# Patient Record
Sex: Male | Born: 1965
Health system: Southern US, Community
[De-identification: ages and names within clinical notes are randomized; demographics above are authoritative.]

## PROBLEM LIST (undated history)

## (undated) DIAGNOSIS — K219 Gastro-esophageal reflux disease without esophagitis: Secondary | ICD-10-CM

## (undated) HISTORY — PX: KNEE SURGERY: SHX244

---

## 2003-02-22 ENCOUNTER — Ambulatory Visit (HOSPITAL_COMMUNITY): Admission: RE | Admit: 2003-02-22 | Discharge: 2003-02-22 | Payer: Self-pay | Admitting: Orthopedic Surgery

## 2020-01-20 ENCOUNTER — Other Ambulatory Visit: Payer: Self-pay

## 2020-01-20 ENCOUNTER — Encounter: Payer: Self-pay | Admitting: Family Medicine

## 2020-01-20 ENCOUNTER — Ambulatory Visit: Payer: 59 | Admitting: Family Medicine

## 2020-01-20 VITALS — BP 112/80 | HR 81 | Ht 70.0 in | Wt 170.0 lb

## 2020-01-20 DIAGNOSIS — M25511 Pain in right shoulder: Secondary | ICD-10-CM | POA: Diagnosis not present

## 2020-01-20 DIAGNOSIS — M7501 Adhesive capsulitis of right shoulder: Secondary | ICD-10-CM | POA: Diagnosis not present

## 2020-01-20 MED ORDER — METHYLPREDNISOLONE ACETATE 40 MG/ML IJ SUSP
80.0000 mg | Freq: Once | INTRAMUSCULAR | Status: AC
  Administered 2020-01-20: 80 mg via INTRA_ARTICULAR

## 2020-01-20 NOTE — Progress Notes (Signed)
Casey Melnyk T. Willye Javier, MD, CAQ Sports Medicine  Primary Care and Sports Medicine [facility] at [facility] [address] [place], [address]  Phone: [phone]  FAX: [phone]  Casey Wu - 53 y.o. male  MRN [id number]  Date of Birth: 06-Apr-1966  Date: 01/20/2020  PCP: No primary care provider on file.  Referral: No ref. provider found  Chief Complaint  Patient presents with  . Right Shoulder Pain    Started about 3-4 weeks ago. Work makes it worse.     This visit occurred during the SARS-CoV-2 public health emergency.  Safety protocols were in place, including screening questions prior to the visit, additional usage of staff PPE, and extensive cleaning of exam room while observing appropriate contact time as indicated for disinfecting solutions.   Subjective:   Casey Wu is a 54 y.o. very pleasant male patient who presents with the following: shoulder pain  The patient noted above presents with shoulder pain that has been ongoing for 1 month there is no history of trauma or accident. The patient denies neck pain or radicular symptoms. No shoulder blade pain Denies dislocation, subluxation, separation of the shoulder. The patient does complain of pain with flexion, abduction, and terminal motion.  Significant restriction of motion. he describes a deep ache around the shoulder, and sometimes it will wake the patient up at night.  Started bothering him at work, and doing a different job. 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  Started to get some shoulder pain and was uing his left a little bit more.   Having to bracewith his right.   No improvement with the weekend and resting.  Was not killing him. 14 days with no improvement. Poppingit will hurt a lot.   Medications Tried: Over-the-counter NSAIDs. Ice or Heat: minimal help Tried PT: No  Prior shoulder Injury: No Prior surgery:  No Prior fracture: No   Review of Systems is noted in the HPI, as appropriate  Objective:   Blood pressure 112/80, pulse 81, height 5\' 10"  (1.778 m), weight 170 lb (77.1 kg), SpO2 97 %.   GEN: No acute distress; alert,appropriate. PULM: Breathing comfortably in no respiratory distress PSYCH: Normally interactive.   Shoulder: R and L Inspection: No muscle wasting or winging Ecchymosis/edema: neg  AC joint, scapula, clavicle: NT Cervical spine: NT, full ROM Spurling's: neg ABNORMAL SIDE TESTED: Right UNLESS OTHERWISE NOTED, THE CONTRALATERAL SIDE HAS FULL RANGE OF MOTION. Abduction: 5/5, LIMITED TO 130 DEGREES Flexion: 5/5, LIMITED TO 130 DEGNO ROM  IR, lift-off: 5/5. TESTED AT 90 DEGREES OF ABDUCTION, LIMITED TO 5 DEGREES ER at neutral:  5/5, TESTED AT 90 DEGREES OF ABDUCTION, LIMITED TO 45 DEGREES AC crossover and compression: PAIN Drop Test: neg Empty Can: neg Supraspinatus insertion: NT Bicipital groove: NT ALL OTHER SPECIAL TESTING EQUIVOCAL GIVEN LOSS OF MOTION C5-T1 intact Sensation intact Grip 5/5   Assessment and Plan:     ICD-10-CM   1. Adhesive capsulitis of right shoulder  M75.01   2. Acute pain of right shoulder  M25.511 methylPREDNISolone acetate (DEPO-MEDROL) injection 80 mg   Level of Medical Decision-Making in this case is MODERATE, he is a new patient to our medical practice.  Patient was given a systematic ROM protocol from [facility] to be done daily. Emphasized importance of adherence, help of PT, daily HEP.  The average length of total symptoms is 12-18 months going through 3  different phases in the freezing and thawing process. Reviewed all with patient.   Tylenol or NSAID of choice prn for pain relief Intraarticular shoulder injections discussed with patient, which have good evidence for accelerating the thawing phase.  Patient will be sent for formal PT for aggressive frozen shoulder ROM if sx persist. Will need RTC str and scapular  stabilization to fix underlying mechanics.  Intraarticular Shoulder Aspiration/Injection Procedure Note Casey Wu 25-Mar-1966 Date of procedure: 01/20/2020  Procedure: Large Joint Aspiration / Injection of Shoulder, Intraarticular, R Indications: Pain  Procedure Details Verbal consent was obtained from the patient. Risks including infection explained and contrasted with benefits and alternatives. Patient prepped with Chloraprep and Ethyl Chloride used for anesthesia. An intraarticular shoulder injection was performed using the posterior approach; needle placed into joint capsule without difficulty. The patient tolerated the procedure well and had decreased pain post injection. No complications. Injection: 8 cc of Lidocaine 1% and 2 mL Depo-Medrol 40 mg. Needle: 21 gauge, 2 inch   Follow-up: Return in about 2 months (around 03/21/2020).  Meds ordered this encounter  Medications  . methylPREDNISolone acetate (DEPO-MEDROL) injection 80 mg   There are no discontinued medications. No orders of the defined types were placed in this encounter.   Signed,  Casey Galea. Yaelis Scharfenberg, MD   Outpatient Encounter Medications as of 01/20/2020  Medication Sig  . Multiple Vitamin (MULTIVITAMIN) tablet Take 1 tablet by mouth daily.  . tadalafil (CIALIS) 20 MG tablet Take 1 tablet by mouth as needed.  . [EXPIRED] methylPREDNISolone acetate (DEPO-MEDROL) injection 80 mg    No facility-administered encounter medications on file as of 01/20/2020.

## 2020-01-21 ENCOUNTER — Encounter: Payer: Self-pay | Admitting: Family Medicine

## 2020-03-01 ENCOUNTER — Ambulatory Visit: Payer: Self-pay | Admitting: Family Medicine

## 2020-03-09 ENCOUNTER — Telehealth: Payer: Self-pay

## 2020-03-09 ENCOUNTER — Ambulatory Visit: Payer: 59 | Admitting: Family Medicine

## 2020-03-09 NOTE — Telephone Encounter (Signed)
[facility] Night - Client Nonclinical Telephone Record AccessNurse Client [facility] Night - Client Client Site [facility] - Night Physician AA - PHYSICIAN, Crissie Figures- MD Contact Type Call Who Is Calling Patient / Member / Family / Caregiver Caller Name Trigo Winterbottom Caller Phone Number [phone] Patient Name Casey Wu Patient DOB 06/17/1965 Call Type Message Only Information Provided Reason for Call Request to [facility] Appointment Initial Comment Caller states he needs to cancel his appointment. Disp. Time Disposition Final User 03/09/2020 3:19:16 AM General Information Provided Yes Dennison Bulla Call Closed By: Dennison Bulla Transaction Date/Time: 03/09/2020 3:17:49 AM (ET)

## 2020-03-22 ENCOUNTER — Ambulatory Visit: Payer: 59 | Admitting: Family Medicine

## 2020-04-24 ENCOUNTER — Ambulatory Visit: Payer: 59 | Admitting: Family Medicine

## 2020-04-24 DIAGNOSIS — Z0289 Encounter for other administrative examinations: Secondary | ICD-10-CM

## 2020-07-05 DIAGNOSIS — Z87891 Personal history of nicotine dependence: Secondary | ICD-10-CM | POA: Diagnosis not present

## 2020-07-05 DIAGNOSIS — W231XXA Caught, crushed, jammed, or pinched between stationary objects, initial encounter: Secondary | ICD-10-CM | POA: Insufficient documentation

## 2020-07-05 DIAGNOSIS — S61210A Laceration without foreign body of right index finger without damage to nail, initial encounter: Secondary | ICD-10-CM | POA: Insufficient documentation

## 2020-07-05 DIAGNOSIS — S6991XA Unspecified injury of right wrist, hand and finger(s), initial encounter: Secondary | ICD-10-CM | POA: Diagnosis present

## 2020-07-06 ENCOUNTER — Encounter: Payer: Self-pay | Admitting: Emergency Medicine

## 2020-07-06 ENCOUNTER — Other Ambulatory Visit: Payer: Self-pay

## 2020-07-06 ENCOUNTER — Emergency Department: Payer: 59

## 2020-07-06 ENCOUNTER — Emergency Department
Admission: EM | Admit: 2020-07-06 | Discharge: 2020-07-06 | Disposition: A | Discharge status: Home / Self Care | Payer: 59 | Attending: Emergency Medicine | Admitting: Emergency Medicine

## 2020-07-06 DIAGNOSIS — S61210A Laceration without foreign body of right index finger without damage to nail, initial encounter: Secondary | ICD-10-CM

## 2020-07-06 MED ORDER — LIDOCAINE HCL (PF) 1 % IJ SOLN
5.0000 mL | Freq: Once | INTRAMUSCULAR | Status: AC
  Administered 2020-07-06: 5 mL
  Filled 2020-07-06: qty 5

## 2020-07-06 MED ORDER — DOUBLE ANTIBIOTIC 500-10000 UNIT/GM EX OINT: TOPICAL_OINTMENT | Freq: Once | CUTANEOUS | Status: DC

## 2020-07-06 MED ORDER — BACITRACIN ZINC 500 UNIT/GM EX OINT: TOPICAL_OINTMENT | Freq: Once | CUTANEOUS | Status: AC

## 2020-07-06 MED ORDER — BACITRACIN ZINC 500 UNIT/GM EX OINT
TOPICAL_OINTMENT | CUTANEOUS | Status: AC
  Filled 2020-07-06: qty 0.9

## 2020-07-06 NOTE — ED Provider Notes (Signed)
[facility] Emergency Department Provider Note  ____________________________________________   I have reviewed the triage vital signs and the nursing notes.   HISTORY  Chief Complaint Laceration   History limited by: Not Limited   HPI Kalik Hoare is a 55 y.o. male who presents to the emergency department today because of concern for laceration to his right index finger. The patient states that it occurred when he was moving a tool box. His finger also got crushed under the toolbox. He denies any other injury.    Records reviewed. Per medical record review, last tetanus shot 2018.  History reviewed. No pertinent past medical history.  There are no problems to display for this patient.   History reviewed. No pertinent surgical history.  Prior to Admission medications   Medication Sig Start Date End Date Taking? Authorizing Provider  Multiple Vitamin (MULTIVITAMIN) tablet Take 1 tablet by mouth daily.    [provider]  tadalafil (CIALIS) 20 MG tablet Take 1 tablet by mouth as needed. 07/13/19   [provider]    Allergies Patient has no known allergies.  History reviewed. No pertinent family history.  Social History Social History   Tobacco Use  . Smoking status: Former Smoker    Quit date: 08/30/2019    Years since quitting: 0.8  . Smokeless tobacco: Never Used  Substance Use Topics  . Alcohol use: Yes  . Drug use: Never    Review of Systems Constitutional: No fever/chills Eyes: No visual changes. ENT: No sore throat. Cardiovascular: Denies chest pain. Respiratory: Denies shortness of breath. Gastrointestinal: No abdominal pain.  No nausea, no vomiting.  No diarrhea.   Genitourinary: Negative for dysuria. Musculoskeletal: Positive for pain to right index finger.  Skin: Positive for laceration to right index finger.  Neurological: Negative for headaches, focal weakness or  numbness.  ____________________________________________   PHYSICAL EXAM:  VITAL SIGNS: ED Triage Vitals  Enc Vitals Group     BP 07/06/20 0003 129/90     Pulse Rate 07/06/20 0003 74     Resp 07/06/20 0003 16     Temp 07/06/20 0003 98.2 F (36.8 C)     Temp Source 07/06/20 0003 Oral     SpO2 07/06/20 0003 98 %     Weight 07/06/20 0005 175 lb (79.4 kg)     Height 07/06/20 0005 5\' 10"  (1.778 m)     Head Circumference --      Peak Flow --      Pain Score 07/06/20 0004 7    Constitutional: Alert and oriented.  Eyes: Conjunctivae are normal.  ENT      Head: Normocephalic and atraumatic.      Nose: No congestion/rhinnorhea.      Mouth/Throat: Mucous membranes are moist.      Neck: No stridor. Hematological/Lymphatic/Immunilogical: No cervical lymphadenopathy. Cardiovascular: Normal rate, regular rhythm.  No murmurs, rubs, or gallops.  Respiratory: Normal respiratory effort without tachypnea nor retractions. Breath sounds are clear and equal bilaterally. No wheezes/rales/rhonchi. Gastrointestinal: Soft and non tender. No rebound. No guarding.  Genitourinary: Deferred Musculoskeletal: Swelling and bruising without deformity to right index finger.  Neurologic:  Normal speech and language. No gross focal neurologic deficits are appreciated.  Skin: 1 cm laceration to right index finger finger pad.  Psychiatric: Mood and affect are normal. Speech and behavior are normal. Patient exhibits appropriate insight and judgment.  ____________________________________________    LABS (pertinent positives/negatives)  None  ____________________________________________   EKG  None  ____________________________________________  RADIOLOGY  Right index finger x-ray No acute osseous abnormality  ____________________________________________   PROCEDURES  Procedures  LACERATION REPAIR Performed by: Phineas Semen Authorized by: Phineas Semen Consent: Verbal consent  obtained. Risks and benefits: risks, benefits and alternatives were discussed Consent given by: patient Patient identity confirmed: provided demographic data Prepped and Draped in normal sterile fashion Wound explored  Laceration Location: right index finger  Laceration Length: 1 cm  No Foreign Bodies seen or palpated  Anesthesia: finger block  Irrigation method: syringe Amount of cleaning: standard  Skin closure: 5-0 vicryl rapide  Number of sutures: 5  Technique: simple interrupted  Patient tolerance: Patient tolerated the procedure well with no immediate complications.  ____________________________________________   INITIAL IMPRESSION / ASSESSMENT AND PLAN / ED COURSE  Pertinent labs & imaging results that were available during my care of the patient were reviewed by me and considered in my medical decision making (see chart for details).   Patient presented to the emergency room because of concerns for laceration to his right index finger.  On exam he did have a 1 cm laceration to the finger pad.  X-ray was obtained which not show any osseous injury.  This wound was cleaned and sutured shut.  We did discuss infection return precautions.  Patient's last tetanus was in 2018.   ____________________________________________   FINAL CLINICAL IMPRESSION(S) / ED DIAGNOSES  Final diagnoses:  Laceration of right index finger without foreign body, nail damage status unspecified, initial encounter     Note: This dictation was prepared with Dragon dictation. Any transcriptional errors that result from this process are unintentional     Phineas Semen, MD 07/06/20 [phone]

## 2020-07-06 NOTE — ED Triage Notes (Signed)
Pt to ED from home c/o laceration to left pointer finger this afternoon.  Pt has finger wrapped from home, difficulty to get bleeding controlled at home but no bleeding at this time.

## 2020-07-06 NOTE — Discharge Instructions (Addendum)
Please seek medical attention for any high fevers, chest pain, shortness of breath, change in behavior, persistent vomiting, bloody stool or any other new or concerning symptoms.  

## 2021-02-15 ENCOUNTER — Encounter: Payer: Self-pay | Admitting: Family Medicine

## 2021-02-15 ENCOUNTER — Other Ambulatory Visit: Payer: Self-pay

## 2021-02-15 ENCOUNTER — Ambulatory Visit: Payer: 59 | Admitting: Family Medicine

## 2021-02-15 VITALS — BP 102/64 | HR 66 | Temp 97.7°F | Ht 69.5 in | Wt 172.0 lb

## 2021-02-15 DIAGNOSIS — M25512 Pain in left shoulder: Secondary | ICD-10-CM | POA: Diagnosis not present

## 2021-02-15 DIAGNOSIS — M25612 Stiffness of left shoulder, not elsewhere classified: Secondary | ICD-10-CM | POA: Diagnosis not present

## 2021-02-15 DIAGNOSIS — G8929 Other chronic pain: Secondary | ICD-10-CM | POA: Diagnosis not present

## 2021-02-15 MED ORDER — TRIAMCINOLONE ACETONIDE 40 MG/ML IJ SUSP
40.0000 mg | Freq: Once | INTRAMUSCULAR | Status: AC
  Administered 2021-02-15: 40 mg via INTRA_ARTICULAR

## 2021-02-15 NOTE — Progress Notes (Signed)
Casey Zeman T. Giovany Cosby, MD, CAQ Sports Medicine Casey Wu [address] [place], [address]  Phone: [phone]  FAX: [phone]  Casey Wu - 55 y.o. male  MRN [id number]  Date of Birth: [birth year]  Date: 02/15/2021  PCP: Forest Gleason, MD  Referral: Forest Gleason, MD  Chief Complaint  Patient presents with   Shoulder Pain    Back of left shoulder    This visit occurred during the SARS-CoV-2 public health emergency.  Safety protocols were in place, including screening questions prior to the visit, additional usage of staff PPE, and extensive cleaning of exam room while observing appropriate contact time as indicated for disinfecting solutions.   Subjective:   Casey Wu is a 55 y.o. very pleasant male patient with Body mass index is 25.04 kg/m. who presents with the following:  L shoulder pain:  I saw him with a frozen shoulder on the R about a year ago.  With golf backswing. He is now having a deep ache in the left shoulder, and he noticed that he is having some loss of motion with the left shoulder as well, particularly in the posterior capsule and pain in the posterior aspect of the shoulder itself.  He has predominantly loss of internal range of motion, but he does have some loss of terminal motion of the other planes of movement.  No trauma or injury.  He is generally fairly healthy gentleman, and he does not take any routine medications.  After his frozen shoulder on the right resolved, he does have full range of motion and strength on the right side.  He is not endorse any neck pain.  He has no radicular pain.  Review of Systems is noted in the HPI, as appropriate   Objective:   BP 102/64   Pulse 66   Temp 97.7 F (36.5 C) (Temporal)   Ht 5' 9.5" (1.765 m)   Wt 172 lb (78 kg)   SpO2 97%   BMI 25.04 kg/m    Shoulder: R and L Inspection: No muscle wasting or winging Ecchymosis/edema: neg  AC joint, scapula, clavicle:  NT Cervical spine: NT, full ROM Spurling's: neg ABNORMAL SIDE TESTED: Left UNLESS OTHERWISE NOTED, THE CONTRALATERAL SIDE HAS FULL RANGE OF MOTION. Abduction: 5/5, LIMITED TO 170 DEGREES Flexion: 5/5, LIMITED TO 170 DEGNO ROM  IR, lift-off: 5/5. TESTED AT 90 DEGREES OF ABDUCTION, LIMITED TO 30 DEGREES ER at neutral:  5/5, TESTED AT 90 DEGREES OF ABDUCTION, LIMITED TO 70 DEGREES AC crossover and compression: PAIN Drop Test: neg Empty Can: neg Supraspinatus insertion: NT Bicipital groove: NT ALL OTHER SPECIAL TESTING EQUIVOCAL GIVEN LOSS OF MOTION C5-T1 intact Sensation intact Grip 5/5    Radiology: No results found.  Assessment and Plan:     ICD-10-CM   1. Shoulder stiffness, left  M25.612     2. Chronic left shoulder pain  M25.512 triamcinolone acetonide (KENALOG-40) injection 40 mg   G89.29      No pain by definition has a true frozen shoulder, but he does have notable loss of motion in the plane of internal range of motion.  Modest motion in abduction loss.  Hopefully, we can aggressively work on his motion and range of motion.  I am also going to do an intra-articular shoulder injection today to hopefully help with pain and hasten his recovery.  Intraarticular Shoulder Aspiration/Injection Procedure Note Casey Wu 1965-09-28 Date of procedure: 02/15/2021  Procedure: Large Joint Aspiration / Injection of  Shoulder, Intraarticular, L Indications: Pain  Procedure Details Verbal consent was obtained from the patient. Risks explained and contrasted with benefits and alternatives. Patient prepped with Chloraprep and Ethyl Chloride used for anesthesia. An intraarticular shoulder injection was performed using the posterior approach; needle placed into joint capsule without difficulty. The patient tolerated the procedure well and had decreased pain post injection. No complications. Injection: 9 cc of Lidocaine 1% and 1 mL Kenalog 40 mg. Needle: 21 gauge, 2 inch   Meds ordered  this encounter  Medications   triamcinolone acetonide (KENALOG-40) injection 40 mg   There are no discontinued medications. No orders of the defined types were placed in this encounter.   Follow-up: 2 months if not resolved.  I did give him a formal program from [place], but if he is not improving then formal physical therapy is most appropriate.  [employer] One speech-to-text software was used for transcription in this dictation.  Possible transcriptional errors can occur using Animal nutritionist.   Signed,  Elpidio Galea. Tysha Grismore, MD   Outpatient Encounter Medications as of 02/15/2021  Medication Sig   Multiple Vitamin (MULTIVITAMIN) tablet Take 1 tablet by mouth daily.   tadalafil (CIALIS) 20 MG tablet Take 1 tablet by mouth as needed.   [EXPIRED] triamcinolone acetonide (KENALOG-40) injection 40 mg    No facility-administered encounter medications on file as of 02/15/2021.

## 2022-11-12 IMAGING — DX DG FINGER INDEX 2+V*R*
3 series · 3 of 3 positions shown · non-contrast
Comparison: None.

CLINICAL DATA: Distal finger injury

EXAM:
RIGHT INDEX FINGER 2+V

[finger ap]
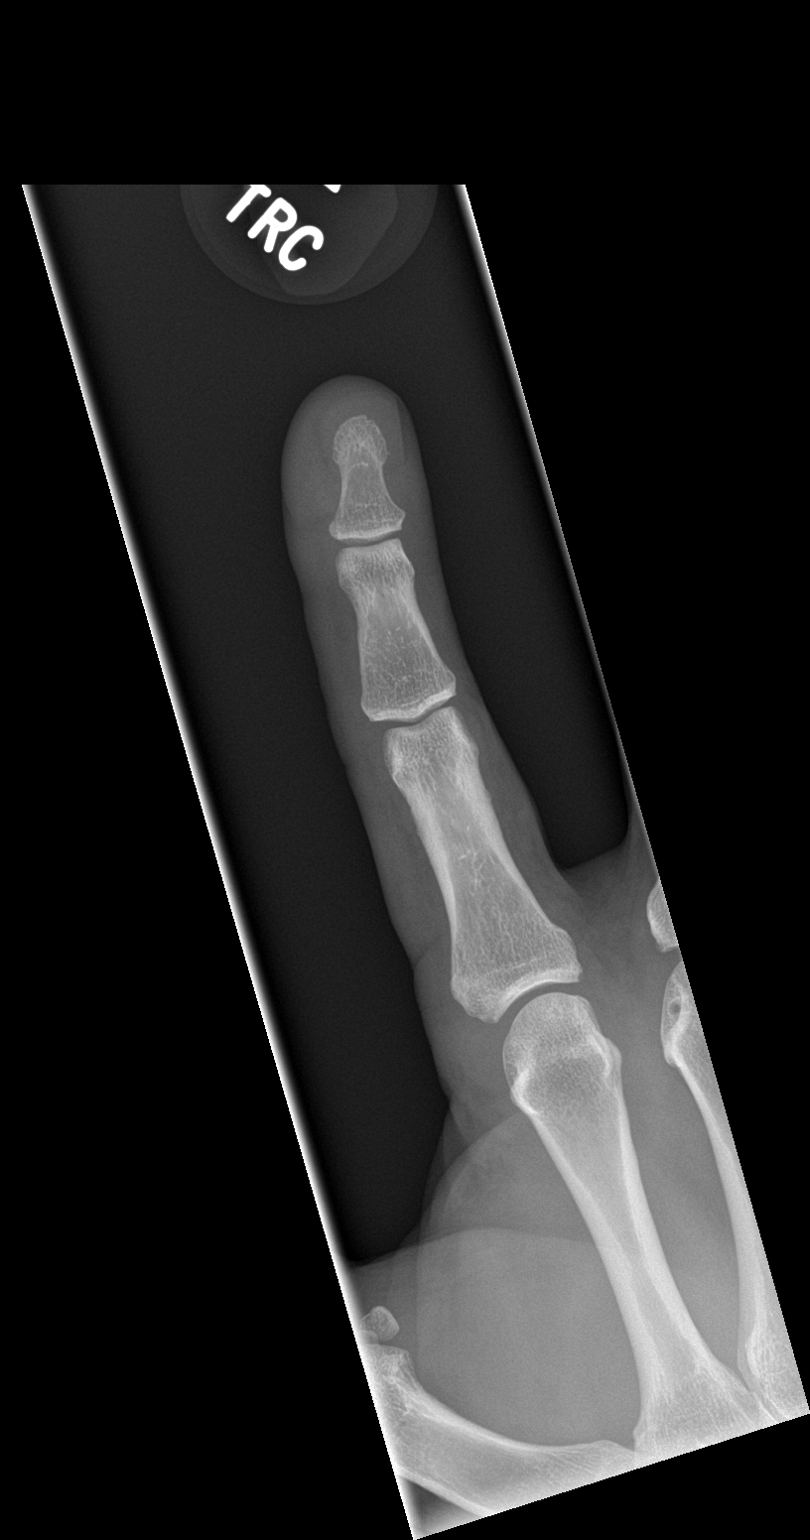

[finger obl]
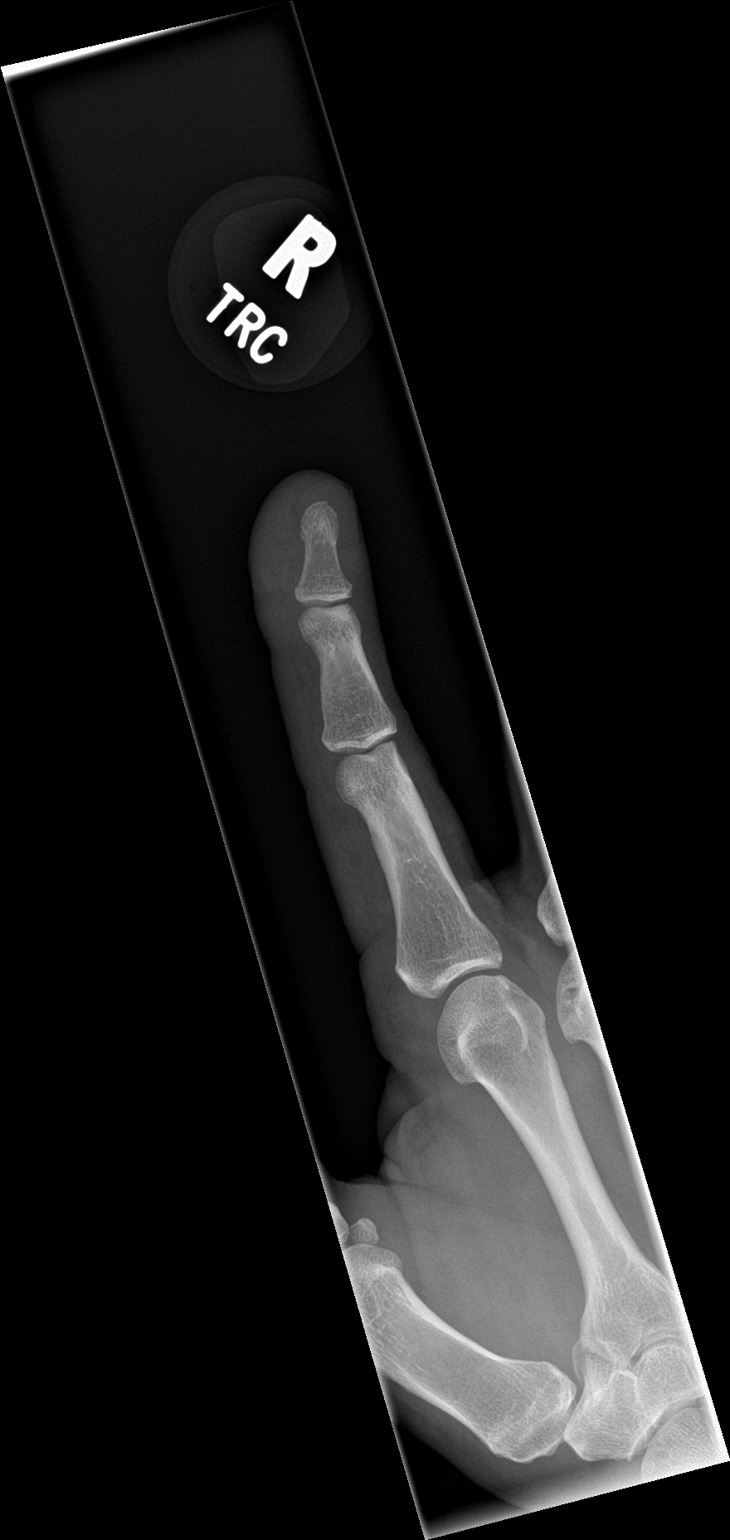

[finger lat]
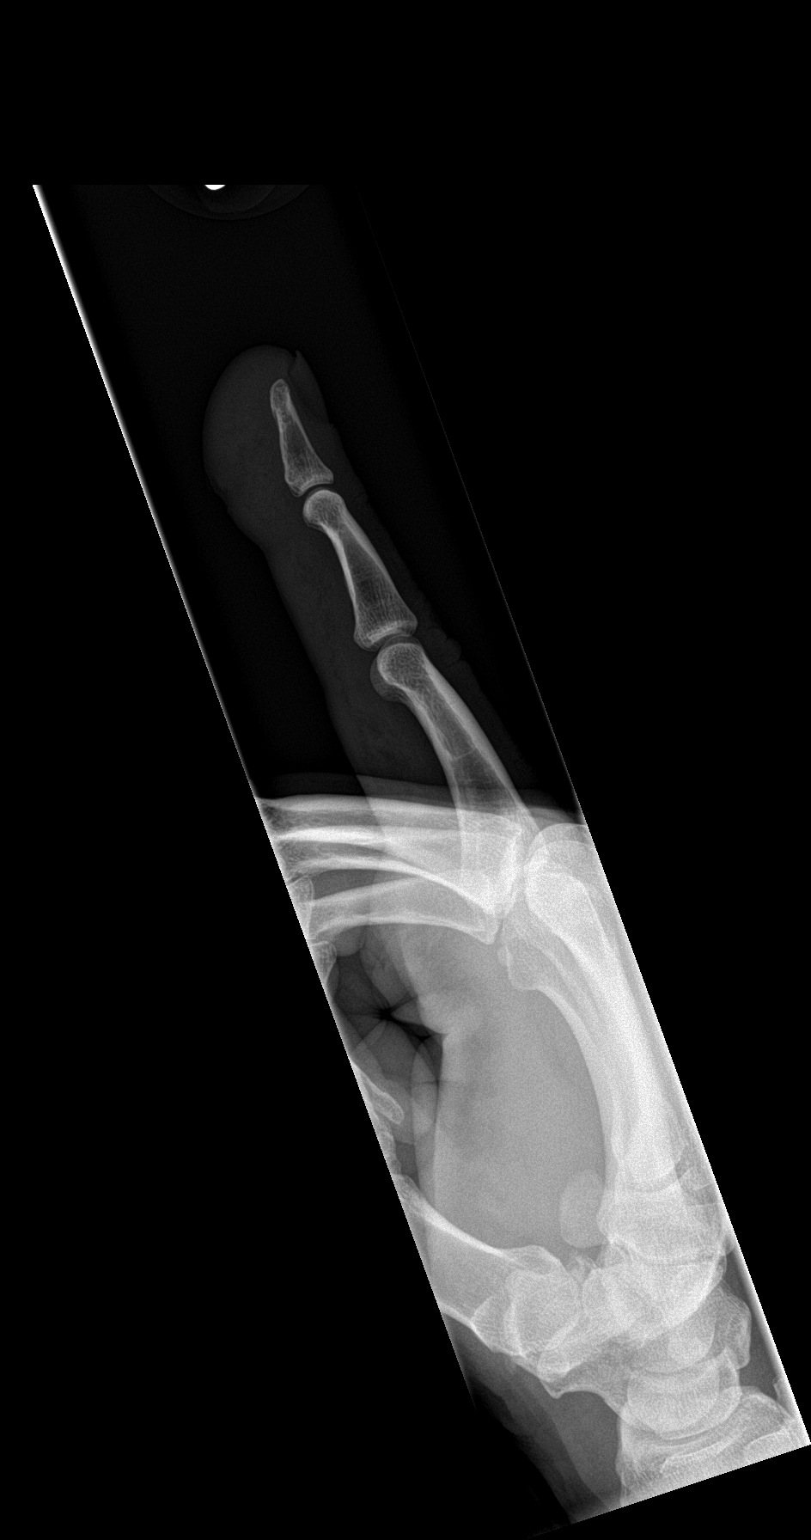

[3 of 3 positions shown; findings below may reference images not displayed]

FINDINGS: Laceration of the distal right index finger. No retained radiopaque
foreign body or osseous injury.
IMPRESSION: Laceration of the distal right index finger without retained
radiopaque foreign body or osseous injury.

## 2023-05-28 ENCOUNTER — Ambulatory Visit (HOSPITAL_BASED_OUTPATIENT_CLINIC_OR_DEPARTMENT_OTHER)
Admission: EM | Admit: 2023-05-28 | Discharge: 2023-05-28 | Disposition: A | Discharge status: Home / Self Care | Payer: 59

## 2023-05-28 ENCOUNTER — Encounter (HOSPITAL_BASED_OUTPATIENT_CLINIC_OR_DEPARTMENT_OTHER): Payer: Self-pay | Admitting: Emergency Medicine

## 2023-05-28 DIAGNOSIS — J069 Acute upper respiratory infection, unspecified: Secondary | ICD-10-CM | POA: Diagnosis not present

## 2023-05-28 DIAGNOSIS — R110 Nausea: Secondary | ICD-10-CM

## 2023-05-28 DIAGNOSIS — R197 Diarrhea, unspecified: Secondary | ICD-10-CM | POA: Diagnosis not present

## 2023-05-28 HISTORY — DX: Gastro-esophageal reflux disease without esophagitis: K21.9

## 2023-05-28 LAB — POCT INFLUENZA A/B
Influenza A, POC: NEGATIVE
Influenza B, POC: NEGATIVE

## 2023-05-28 MED ORDER — LOPERAMIDE HCL 2 MG PO TABS: 2.0000 mg | ORAL_TABLET | Freq: Four times a day (QID) | ORAL | Status: AC | PRN

## 2023-05-28 MED ORDER — PROMETHAZINE HCL 12.5 MG PO TABS: 12.5000 mg | ORAL_TABLET | Freq: Four times a day (QID) | ORAL | 1 refills | Status: AC | PRN

## 2023-05-28 NOTE — ED Triage Notes (Signed)
Pt having body aches, fatigue, decreased appetite, nausea, diarrhea, chills since Sunday night. Reports been out of work and needs a note for at least 3 days to be out. Taking Nyquil, vit C and D.

## 2023-05-28 NOTE — ED Notes (Signed)
Pt given cup ice water

## 2023-05-28 NOTE — Discharge Instructions (Addendum)
Negative for influenza.  Most likely has a viral illness with respiratory and gastrointestinal manifestations.  Will treat with promethazine, 12.5 mg, 1 pill, every 8 hours, as needed for nausea or vomiting.  May switch the promethazine to a suppository if needed.  May use Imodium A-D, up to 4 times daily for diarrhea.  Discussed that using Imodium A-D might actually prolong the diarrhea or could cause constipation.  Provided information on rehydration and foods to decrease or limit diarrhea.  Follow-up if symptoms do not improve, worsen or new symptoms occur.  Discussed signs and symptoms of dehydration and when to go to an emergency room.  Provided a work excuse.

## 2023-05-28 NOTE — ED Provider Notes (Signed)
[facility]    CSN: [id number] Arrival date & time: 05/28/23  1018      History   Chief Complaint Chief Complaint  Patient presents with   Chills   Diarrhea    HPI Casey Wu is a 58 y.o. male.   Pt having body aches, fatigue, decreased appetite, nausea, diarrhea, chills since Sunday night. Reports been out of work and needs a note for at least 3 days to be out. Taking Nyquil, vit C and D.    Diarrhea Associated symptoms: arthralgias and chills   Associated symptoms: no abdominal pain, no fever and no vomiting     Past Medical History:  Diagnosis Date   Acid reflux     There are no active problems to display for this patient.   Past Surgical History:  Procedure Laterality Date   KNEE SURGERY         Home Medications    Prior to Admission medications   Medication Sig Start Date End Date Taking? Authorizing Provider  loperamide (IMODIUM A-D) 2 MG tablet Take 1 tablet (2 mg total) by mouth 4 (four) times daily as needed for diarrhea or loose stools. 05/28/23  Yes Prescilla Sours, FNP  promethazine (PHENERGAN) 12.5 MG tablet Take 1 tablet (12.5 mg total) by mouth every 6 (six) hours as needed for nausea or vomiting. 05/28/23  Yes Prescilla Sours, FNP  LORazepam (ATIVAN) 0.5 MG tablet Take 0.5 mg by mouth 2 (two) times daily.    [provider]  Multiple Vitamin (MULTIVITAMIN) tablet Take 1 tablet by mouth daily.    [provider]  tadalafil (CIALIS) 20 MG tablet Take 1 tablet by mouth as needed. 07/13/19   [provider]  testosterone cypionate (DEPOTESTOSTERONE CYPIONATE) 200 MG/ML injection Inject 200 mg into the muscle every 14 (fourteen) days.    [provider]    Family History No family history on file.  Social History Social History   Tobacco Use   Smoking status: Former    Current packs/day: 0.00    Types: Cigarettes    Quit date: 08/30/2019    Years since quitting: 3.7   Smokeless tobacco: Never   Substance Use Topics   Alcohol use: Yes   Drug use: Never     Allergies   Patient has no known allergies.   Review of Systems Review of Systems  Constitutional:  Positive for appetite change and chills. Negative for fever.  HENT:  Negative for congestion, ear pain and sore throat.   Eyes:  Negative for pain and visual disturbance.  Respiratory:  Negative for cough and shortness of breath.   Cardiovascular:  Negative for chest pain and palpitations.  Gastrointestinal:  Positive for diarrhea and nausea. Negative for abdominal pain, constipation and vomiting.  Genitourinary:  Negative for dysuria and hematuria.  Musculoskeletal:  Positive for arthralgias. Negative for back pain.  Skin:  Negative for color change and rash.  Neurological:  Negative for seizures and syncope.  All other systems reviewed and are negative.    Physical Exam Triage Vital Signs ED Triage Vitals  Encounter Vitals Group     BP 05/28/23 1100 (!) 159/112     Systolic BP Percentile --      Diastolic BP Percentile --      Pulse Rate 05/28/23 1100 87     Resp 05/28/23 1100 18     Temp 05/28/23 1100 97.6 F (36.4 C)     Temp Source 05/28/23 1100 Oral  SpO2 05/28/23 1100 99 %     Weight --      Height --      Head Circumference --      Peak Flow --      Pain Score 05/28/23 1057 5     Pain Loc --      Pain Education --      Exclude from Growth Chart --    No data found.  Updated Vital Signs BP (!) 149/105 (BP Location: Right Arm) Comment: taken by Maralyn Sago NP  Pulse 87   Temp 98.3 F (36.8 C) (Oral)   Resp 18   SpO2 99%   Visual Acuity Right Eye Distance:   Left Eye Distance:   Bilateral Distance:    Right Eye Near:   Left Eye Near:    Bilateral Near:     Physical Exam Vitals and nursing note reviewed.  Constitutional:      General: He is not in acute distress.    Appearance: He is well-developed.  HENT:     Head: Normocephalic and atraumatic.  Eyes:     Conjunctiva/sclera:  Conjunctivae normal.  Cardiovascular:     Rate and Rhythm: Normal rate and regular rhythm.     Heart sounds: No murmur heard. Pulmonary:     Effort: Pulmonary effort is normal. No respiratory distress.     Breath sounds: Normal breath sounds.  Abdominal:     Palpations: Abdomen is soft.     Tenderness: There is no abdominal tenderness.  Musculoskeletal:        General: No swelling.     Cervical back: Neck supple.  Skin:    General: Skin is warm and dry.     Capillary Refill: Capillary refill takes less than 2 seconds.  Neurological:     Mental Status: He is alert.  Psychiatric:        Mood and Affect: Mood normal.      UC Treatments / Results  Labs (all labs ordered are listed, but only abnormal results are displayed) Labs Reviewed  POCT INFLUENZA A/B - Normal    EKG   Radiology No results found.  Procedures Procedures (including critical care time)  Medications Ordered in UC Medications - No data to display  Initial Impression / Assessment and Plan / UC Course  I have reviewed the triage vital signs and the nursing notes.  Pertinent labs & imaging results that were available during my care of the patient were reviewed by me and considered in my medical decision making (see chart for details).  Negative for flu.  History and exam findings most consistent with a viral upper respiratory infection with some GI manifestations.  Provided promethazine, 12.5 mg, every 8 hours, as needed for nausea or vomiting.  May switch to suppository if needed.  Discussed use of OTC Imodium A-D.  Take it as directed on the package but be cautious that it could either prolong viral diarrhea or could cause constipation.  Get plenty of fluids and rest.  Provided information on foods that would help limit diarrhea.  Provided a work excuse.  Follow-up if symptoms do not improve, worsen or new symptoms occur.  Final Clinical Impressions(s) / UC Diagnoses   Final diagnoses:  Viral URI with  cough  Nausea  Diarrhea, unspecified type     Discharge Instructions      Negative for influenza.  Most likely has a viral illness with respiratory and gastrointestinal manifestations.  Will treat with promethazine, 12.5 mg, 1  pill, every 8 hours, as needed for nausea or vomiting.  May switch the promethazine to a suppository if needed.  May use Imodium A-D, up to 4 times daily for diarrhea.  Discussed that using Imodium A-D might actually prolong the diarrhea or could cause constipation.  Provided information on rehydration and foods to decrease or limit diarrhea.  Follow-up if symptoms do not improve, worsen or new symptoms occur.  Discussed signs and symptoms of dehydration and when to go to an emergency room.  Provided a work excuse.     ED Prescriptions     Medication Sig Dispense Auth. Provider   promethazine (PHENERGAN) 12.5 MG tablet Take 1 tablet (12.5 mg total) by mouth every 6 (six) hours as needed for nausea or vomiting. 20 tablet Prescilla Sours, FNP   loperamide (IMODIUM A-D) 2 MG tablet Take 1 tablet (2 mg total) by mouth 4 (four) times daily as needed for diarrhea or loose stools. -- Prescilla Sours, FNP      PDMP not reviewed this encounter.   Prescilla Sours, FNP 05/28/23 1209
